# Patient Record
Sex: Male | Born: 1960 | Race: White | Hispanic: No | Marital: Single | Smoking: Current every day smoker
Health system: Southern US, Community
[De-identification: ages and names within clinical notes are randomized; demographics above are authoritative.]

## PROBLEM LIST (undated history)

## (undated) DIAGNOSIS — I38 Endocarditis, valve unspecified: Secondary | ICD-10-CM

## (undated) DIAGNOSIS — E785 Hyperlipidemia, unspecified: Secondary | ICD-10-CM

## (undated) HISTORY — PX: JOINT REPLACEMENT: SHX530

---

## 2018-05-17 ENCOUNTER — Encounter (HOSPITAL_COMMUNITY): Payer: Self-pay

## 2018-05-17 ENCOUNTER — Emergency Department (HOSPITAL_COMMUNITY)
Admission: EM | Admit: 2018-05-17 | Discharge: 2018-05-17 | Disposition: A | Discharge status: Home / Self Care | Payer: Medicare Other | Attending: Emergency Medicine | Admitting: Emergency Medicine

## 2018-05-17 ENCOUNTER — Emergency Department (HOSPITAL_COMMUNITY): Payer: Medicare Other

## 2018-05-17 DIAGNOSIS — E785 Hyperlipidemia, unspecified: Secondary | ICD-10-CM | POA: Insufficient documentation

## 2018-05-17 DIAGNOSIS — J40 Bronchitis, not specified as acute or chronic: Secondary | ICD-10-CM

## 2018-05-17 DIAGNOSIS — F17210 Nicotine dependence, cigarettes, uncomplicated: Secondary | ICD-10-CM | POA: Insufficient documentation

## 2018-05-17 DIAGNOSIS — R0789 Other chest pain: Secondary | ICD-10-CM | POA: Diagnosis present

## 2018-05-17 DIAGNOSIS — R091 Pleurisy: Secondary | ICD-10-CM | POA: Diagnosis not present

## 2018-05-17 HISTORY — DX: Hyperlipidemia, unspecified: E78.5

## 2018-05-17 HISTORY — DX: Endocarditis, valve unspecified: I38

## 2018-05-17 LAB — BASIC METABOLIC PANEL
Anion gap: 9 (ref 5–15)
BUN: 14 mg/dL (ref 6–20)
CO2: 20 mmol/L — ABNORMAL LOW (ref 22–32)
Calcium: 9 mg/dL (ref 8.9–10.3)
Chloride: 109 mmol/L (ref 98–111)
Creatinine, Ser: 1.03 mg/dL (ref 0.61–1.24)
GFR calc Af Amer: >60 mL/min (ref >60)
Glucose, Bld: 100 mg/dL — ABNORMAL HIGH (ref 70–99)
Potassium: 5.2 mmol/L — ABNORMAL HIGH (ref 3.5–5.1)
Sodium: 138 mmol/L (ref 135–145)

## 2018-05-17 LAB — CBC
HCT: 52 % (ref 39.0–52.0)
Hemoglobin: 17.1 g/dL — ABNORMAL HIGH (ref 13.0–17.0)
MCH: 30.5 pg (ref 26.0–34.0)
MCHC: 32.9 g/dL (ref 30.0–36.0)
MCV: 92.9 fL (ref 80.0–100.0)
PLATELETS: 170 10*3/uL (ref 150–400)
RBC: 5.6 MIL/uL (ref 4.22–5.81)
RDW: 12 % (ref 11.5–15.5)
WBC: 14.8 10*3/uL — ABNORMAL HIGH (ref 4.0–10.5)
nRBC: 0 % (ref 0.0–0.2)

## 2018-05-17 LAB — I-STAT TROPONIN, ED: Troponin i, poc: 0.01 ng/mL (ref 0.00–0.08)

## 2018-05-17 LAB — I-STAT CG4 LACTIC ACID, ED: Lactic Acid, Venous: 1 mmol/L (ref 0.5–1.9)

## 2018-05-17 MED ORDER — SODIUM CHLORIDE 0.9% FLUSH: 3.0000 mL | Freq: Once | INTRAVENOUS | Status: DC

## 2018-05-17 MED ORDER — IOPAMIDOL (ISOVUE-370) INJECTION 76%
INTRAVENOUS | Status: AC
  Administered 2018-05-17: 15:00:00
  Filled 2018-05-17: qty 100

## 2018-05-17 MED ORDER — HYDROCODONE-HOMATROPINE 5-1.5 MG/5ML PO SYRP: 5.0000 mL | ORAL_SOLUTION | Freq: Four times a day (QID) | ORAL | 0 refills | Status: AC | PRN

## 2018-05-17 MED ORDER — ALBUTEROL SULFATE HFA 108 (90 BASE) MCG/ACT IN AERS: 1.0000 | INHALATION_SPRAY | Freq: Four times a day (QID) | RESPIRATORY_TRACT | 0 refills | Status: AC | PRN

## 2018-05-17 MED ORDER — AZITHROMYCIN 250 MG PO TABS: ORAL_TABLET | ORAL | 0 refills | Status: AC

## 2018-05-17 MED ORDER — KETOROLAC TROMETHAMINE 30 MG/ML IJ SOLN
30.0000 mg | Freq: Once | INTRAMUSCULAR | Status: AC
  Administered 2018-05-17: 30 mg via INTRAVENOUS
  Filled 2018-05-17: qty 1

## 2018-05-17 MED ORDER — IBUPROFEN 600 MG PO TABS: 600.0000 mg | ORAL_TABLET | Freq: Four times a day (QID) | ORAL | 0 refills | Status: AC | PRN

## 2018-05-17 MED ORDER — IPRATROPIUM-ALBUTEROL 0.5-2.5 (3) MG/3ML IN SOLN
3.0000 mL | Freq: Once | RESPIRATORY_TRACT | Status: AC
  Administered 2018-05-17: 3 mL via RESPIRATORY_TRACT
  Filled 2018-05-17: qty 3

## 2018-05-17 NOTE — ED Triage Notes (Signed)
Pt arrives from home c/o chest pain on left side radiating to left shoulder. Pt stated he took "dayquil" per the pharmacist ([facility] that "he doesn't feel good." Pt states chest pain feels "like elephant sitting on chest," and intermittent stabbing pain. VSS at this time.

## 2018-05-17 NOTE — ED Notes (Signed)
ED Provider at bedside. 

## 2018-05-17 NOTE — ED Provider Notes (Signed)
[facility] EMERGENCY DEPARTMENT Provider Note   CSN: [id number] Arrival date & time: 05/17/18  1105     History   Chief Complaint Chief Complaint  Patient presents with  . Chest Pain    HPI Paul Beck is a 58 y.o. male.  HPI Patient reports that he started developing left-sided chest pain earlier today.  He reports that it radiates to his left shoulder.  He reports that he started having coughing after this started.  He reports now he has been coughing and having a sharp chest pain.  He denies he has had a fever that he is aware.  He reports he is starting developed some nasal congestion as well.  He reports he generally feels poorly.  Patient reports he is concerned because this feels similar to when he had a case of endocarditis a number of years ago.  He reports he never knew the reason that he got that.  He did not know if it was viral or bacterial.  Patient denies he has any drug abuse history.  He does smoke regularly.  No lower extremity swelling or calf pain.  Patient does not currently seek medical care in the area. Past Medical History:  Diagnosis Date  . Endocarditis   . Hyperlipemia     There are no active problems to display for this patient.   Past Surgical History:  Procedure Laterality Date  . JOINT REPLACEMENT     bilateral hip replacement 2010, 2013        Home Medications    Prior to Admission medications   Medication Sig Start Date End Date Taking? Authorizing Provider  Acetaminophen-DM (VICKS DAYQUIL HBP COLD & FLU) 325-10 MG CAPS Take 1-2 capsules by mouth every 8 (eight) hours as needed (for cold-like symptoms).    Yes [provider]  albuterol (PROVENTIL HFA;VENTOLIN HFA) 108 (90 Base) MCG/ACT inhaler Inhale 1-2 puffs into the lungs every 6 (six) hours as needed for wheezing or shortness of breath. 05/17/18   Arby Barrette, MD  azithromycin (ZITHROMAX Z-PAK) 250 MG tablet 2 po day one, then 1 daily x 4 days  05/17/18   Arby Barrette, MD  HYDROcodone-homatropine [facility]) 5-1.5 MG/5ML syrup Take 5 mLs by mouth every 6 (six) hours as needed for cough. 1 to 2 teaspoons for cough at nighttime. 05/17/18   Arby Barrette, MD  ibuprofen (ADVIL,MOTRIN) 600 MG tablet Take 1 tablet (600 mg total) by mouth every 6 (six) hours as needed. 05/17/18   Arby Barrette, MD    Family History History reviewed. No pertinent family history.  Social History Social History   Tobacco Use  . Smoking status: Current Every Day Smoker    Types: Cigarettes  . Smokeless tobacco: Never Used  . Tobacco comment: smoking since 14  Substance Use Topics  . Alcohol use: Not Currently  . Drug use: Not Currently     Allergies   Patient has no known allergies.   Review of Systems Review of Systems 10 Systems reviewed and are negative for acute change except as noted in the HPI.   Physical Exam Updated Vital Signs BP 128/82   Pulse 61   Temp 98.2 F (36.8 C)   Resp 14   Ht 6\' 6"  (1.981 m)   Wt 117 kg   SpO2 95%   BMI 29.81 kg/m   Physical Exam Constitutional:      Appearance: He is well-developed.     Comments: Patient is nontoxic and alert.  No  respiratory distress at rest.  Intermittent harsh cough paroxysmal.  HENT:     Head: Normocephalic and atraumatic.     Nose: Nose normal.     Mouth/Throat:     Mouth: Mucous membranes are moist.     Pharynx: Oropharynx is clear.  Eyes:     Pupils: Pupils are equal, round, and reactive to light.  Neck:     Musculoskeletal: Neck supple.  Cardiovascular:     Rate and Rhythm: Normal rate and regular rhythm.     Heart sounds: Normal heart sounds.  Pulmonary:     Effort: Pulmonary effort is normal.     Comments: No respiratory distress.  Intermittent coughing.  Expiratory wheeze scattered in the lung fields soft breath sounds at the bases. Abdominal:     General: Bowel sounds are normal. There is no distension.     Palpations: Abdomen is soft.     Tenderness:  There is no abdominal tenderness.  Musculoskeletal: Normal range of motion.        General: No swelling or tenderness.     Right lower leg: No edema.     Left lower leg: No edema.  Skin:    General: Skin is warm and dry.  Neurological:     Mental Status: He is alert and oriented to person, place, and time.     GCS: GCS eye subscore is 4. GCS verbal subscore is 5. GCS motor subscore is 6.     Coordination: Coordination normal.  Psychiatric:        Mood and Affect: Mood normal.      ED Treatments / Results  Labs (all labs ordered are listed, but only abnormal results are displayed) Labs Reviewed  BASIC METABOLIC PANEL - Abnormal; Notable for the following components:      Result Value   Potassium 5.2 (*)    CO2 20 (*)    Glucose, Bld 100 (*)    All other components within normal limits  CBC - Abnormal; Notable for the following components:   WBC 14.8 (*)    Hemoglobin 17.1 (*)    All other components within normal limits  I-STAT TROPONIN, ED  I-STAT CG4 LACTIC ACID, ED    EKG EKG Interpretation  Date/Time:  Wednesday May 17 2018 11:14:23 EST Ventricular Rate:  76 PR Interval:    QRS Duration: 94 QT Interval:  368 QTC Calculation: 414 R Axis:   71 Text Interpretation:  Sinus rhythm Baseline wander in lead(s) I aVL normal Confirmed by Arby BarrettePfeiffer, Florence Antonelli [phone]) on 05/17/2018 12:05:58 PM Also confirmed by Arby BarrettePfeiffer, Johnluke Haugen [phone]), editor Barbette Hairassel, Kerry [phone])  on 05/17/2018 2:51:22 PM   Radiology Dg Chest 2 View  Result Date: 05/17/2018 CLINICAL DATA:  Chest pain, shortness of breath and productive cough since last night. EXAM: CHEST - 2 VIEW COMPARISON:  None. FINDINGS: The lungs are clear. There is some peribronchial thickening. No pneumothorax or pleural effusion. Heart size is normal. No acute or focal bony abnormality. IMPRESSION: Bronchitic change.  No focal abnormality. Electronically Signed   By: Drusilla Kannerhomas  Dalessio M.D.   On: 05/17/2018 12:24   Ct Angio Chest Pe  W/cm &/or Wo Cm  Result Date: 05/17/2018 CLINICAL DATA:  Shortness of breath, chest pain EXAM: CT ANGIOGRAPHY CHEST WITH CONTRAST TECHNIQUE: Multidetector CT imaging of the chest was performed using the standard protocol during bolus administration of intravenous contrast. Multiplanar CT image reconstructions and MIPs were obtained to evaluate the vascular anatomy. CONTRAST:  ISOVUE-370 IOPAMIDOL (ISOVUE-370) INJECTION 76%  COMPARISON:  None. FINDINGS: Cardiovascular: Satisfactory opacification of the pulmonary arteries to the segmental level. No evidence of pulmonary embolism. Normal heart size. No pericardial effusion. Mediastinum/Nodes: No axillary lymphadenopathy. Prominent subcarinal lymph node measuring 14 mm in short axis. Mild right hilar lymphadenopathy. Thyroid gland, trachea, and esophagus demonstrate no significant findings. Lungs/Pleura: Lungs are clear. Mild bilateral bronchial wall thickening as can be seen with bronchitis. No pleural effusion or pneumothorax. Upper Abdomen: No acute abnormality. Musculoskeletal: No chest wall abnormality. No acute or significant osseous findings. Review of the MIP images confirms the above findings. IMPRESSION: 1. No evidence of pulmonary embolus. 2. Mild bilateral bronchial wall thickening as can be seen with bronchitis. Electronically Signed   By: Elige Ko   On: 05/17/2018 15:02    Procedures Procedures (including critical care time)  Medications Ordered in ED Medications  ipratropium-albuterol (DUONEB) 0.5-2.5 (3) MG/3ML nebulizer solution 3 mL (3 mLs Nebulization Given 05/17/18 1305)  iopamidol (ISOVUE-370) 76 % injection (  Contrast Given 05/17/18 1448)  ketorolac (TORADOL) 30 MG/ML injection 30 mg (30 mg Intravenous Given 05/17/18 1543)     Initial Impression / Assessment and Plan / ED Course  I have reviewed the triage vital signs and the nursing notes.  Pertinent labs & imaging results that were available during my care of the patient were  reviewed by me and considered in my medical decision making (see chart for details).     Patient describes chest pain with pleuritic component to it CT study does not show acute PE or underlying pneumonia.  Patient reports history of endocarditis but at this time has no murmur, no fever, no apparent risk factors.  Diagnostic evaluation is otherwise within normal limits except leukocytosis.  With smoking history, leukocytosis and finding suggestive of bronchitis\COPD will initiate on inhalers, cough suppressant and Zithromax.  Return precautions reviewed.  Patient is counseled on necessity of getting established with a primary care provider and having close follow-up.  Final Clinical Impressions(s) / ED Diagnoses   Final diagnoses:  Pleurisy  Bronchitis    ED Discharge Orders         Ordered    azithromycin (ZITHROMAX Z-PAK) 250 MG tablet     05/17/18 1533    albuterol (PROVENTIL HFA;VENTOLIN HFA) 108 (90 Base) MCG/ACT inhaler  Every 6 hours PRN     05/17/18 1533    HYDROcodone-homatropine (HYCODAN) 5-1.5 MG/5ML syrup  Every 6 hours PRN     05/17/18 1533    ibuprofen (ADVIL,MOTRIN) 600 MG tablet  Every 6 hours PRN     05/17/18 1533           Arby Barrette, MD 05/17/18 1819

## 2018-05-17 NOTE — Discharge Instructions (Signed)
1.  You need a recheck with a family doctor.  Use the referral number in your discharge instructions to find one.  Return to the emergency department if your symptoms are worsening or changing.

## 2018-05-17 NOTE — ED Notes (Addendum)
Patient transported to CT 

## 2020-08-13 IMAGING — CT CT ANGIO CHEST
3 of 7 series · 19 of 36 positions shown · IV contrast (iopamidol)
Comparison: None.

CLINICAL DATA: Shortness of breath, chest pain

EXAM:
CT ANGIOGRAPHY CHEST WITH CONTRAST
TECHNIQUE: Multidetector CT imaging of the chest was performed using the
standard protocol during bolus administration of intravenous
contrast. Multiplanar CT image reconstructions and MIPs were
obtained to evaluate the vascular anatomy.
CONTRAST:  B15VRZ-[id number] IOPAMIDOL (B15VRZ-MZ9) INJECTION 76%

[Series 6: pe lung · axial · 0.84mm/px · z∈[+1186,+1326]mm · 3 of 142 slices shown]
[im 36/142  mediastinal]
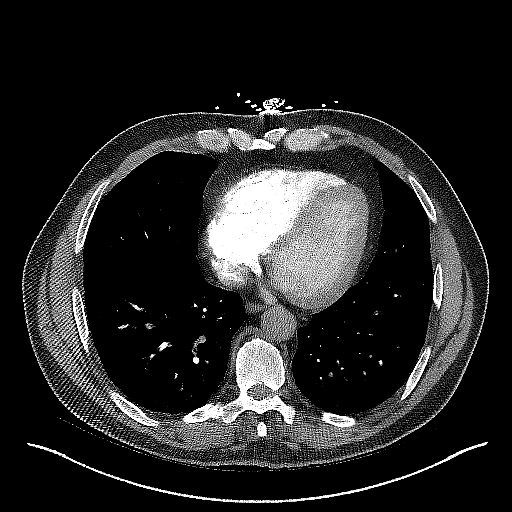
[im 71/142  mediastinal]
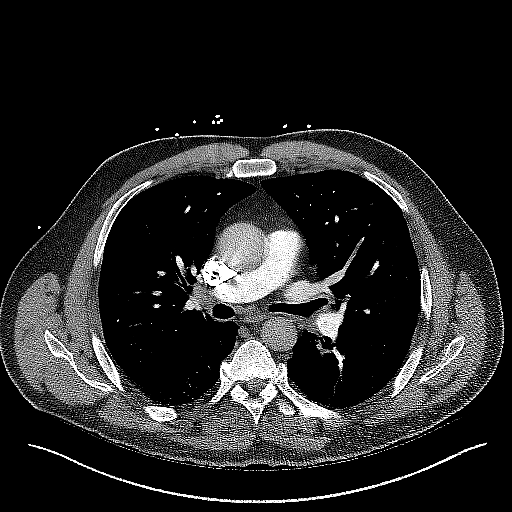
[im 106/142  mediastinal]
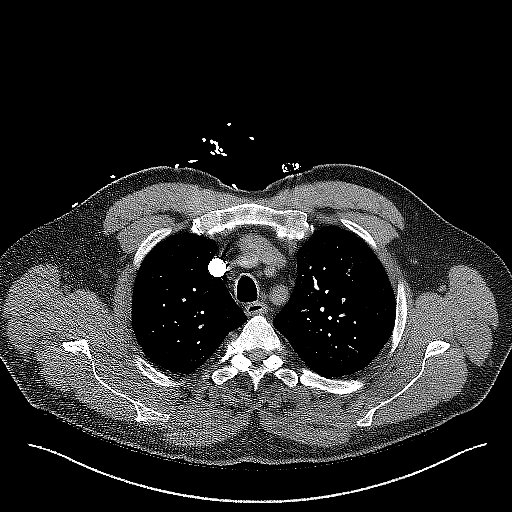

[Series 7: pe thins · axial · 0.84mm/px · z∈[+1082,+1376]mm · 15 of 483 slices shown]
[im 31/483  lung]
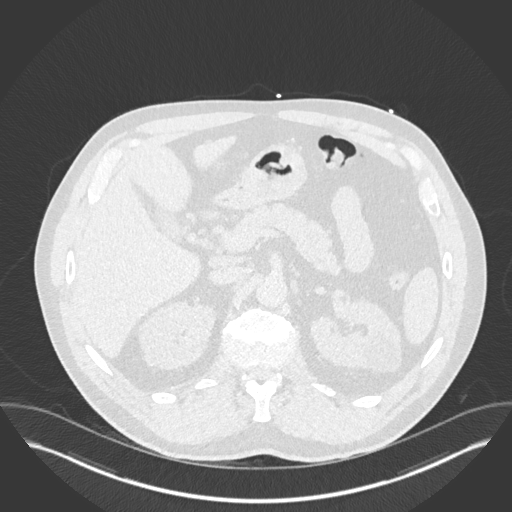
[im 61/483  mediastinal]
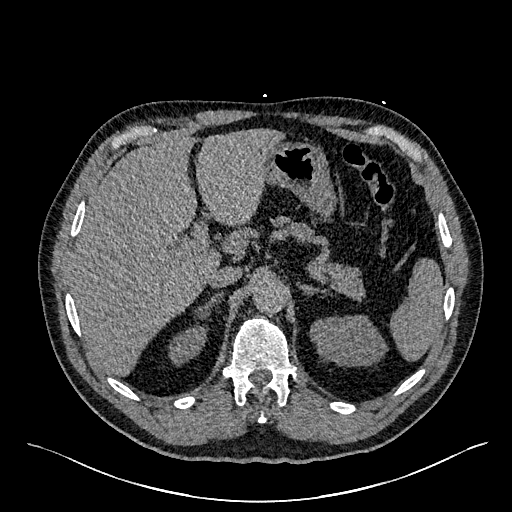
[im 91/483  lung]
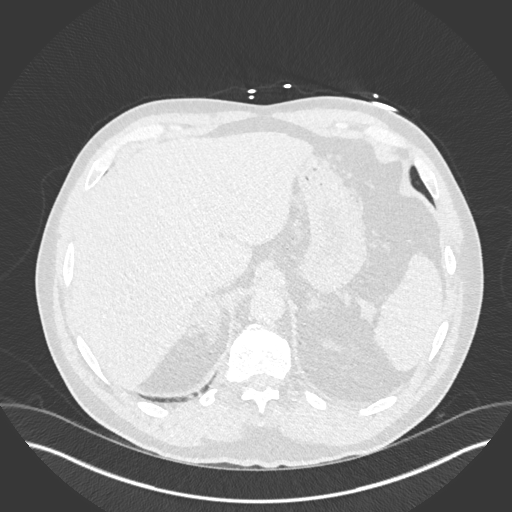
[im 121/483  mediastinal]
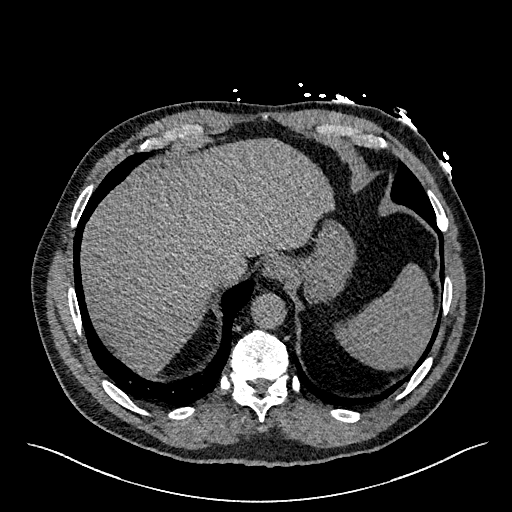
[im 151/483  lung]
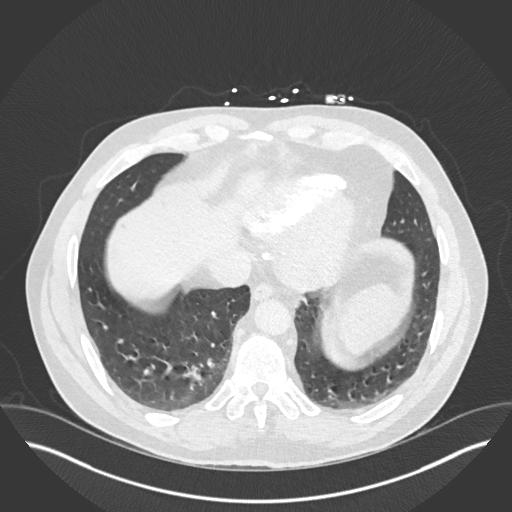
[im 181/483  mediastinal]
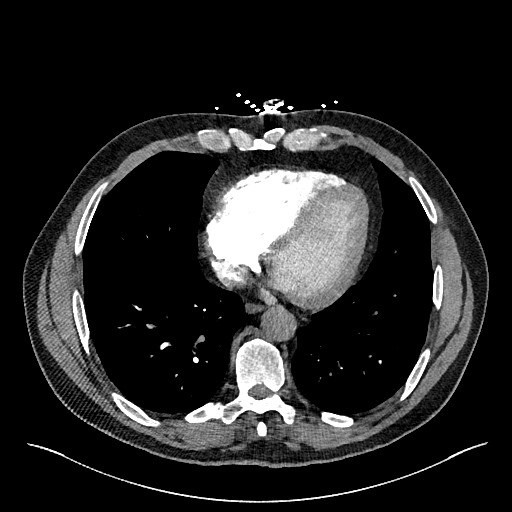
[im 211/483  lung]
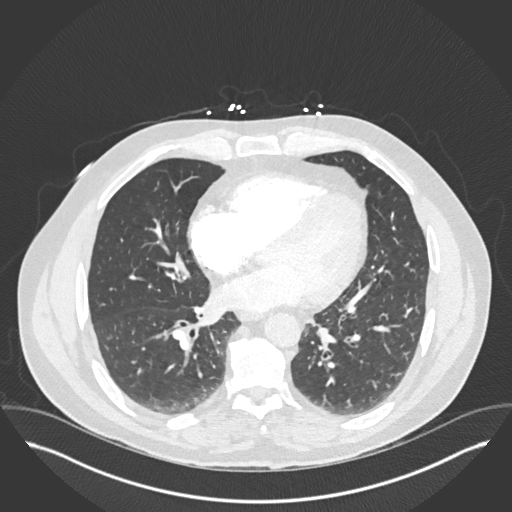
[im 242/483  mediastinal]
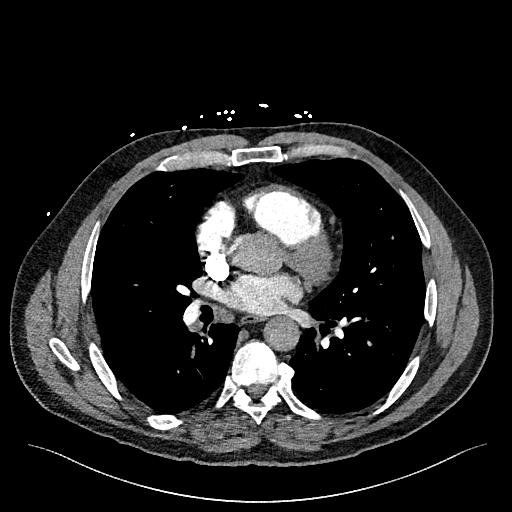
[im 272/483  lung]
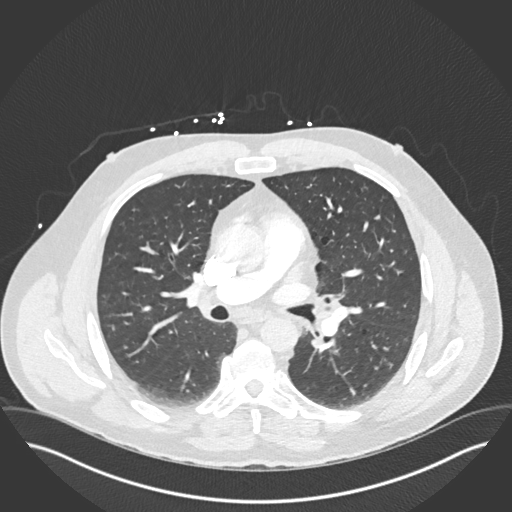
[im 302/483  mediastinal]
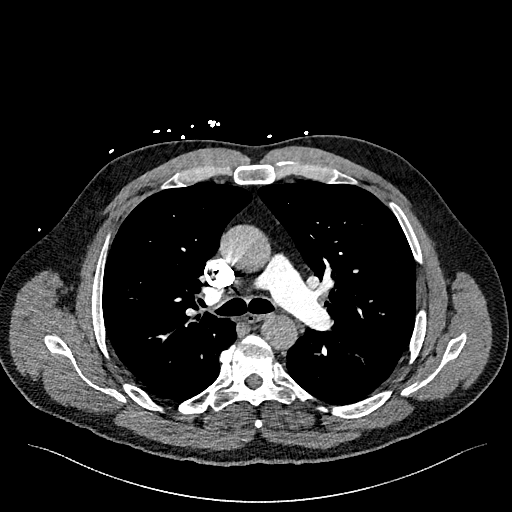
[im 332/483  lung]
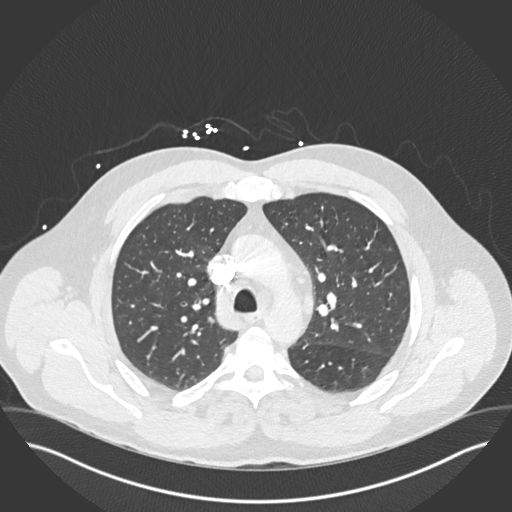
[im 362/483  mediastinal]
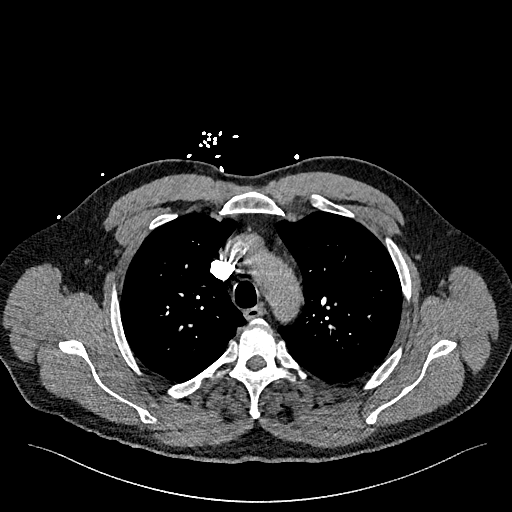
[im 392/483  lung]
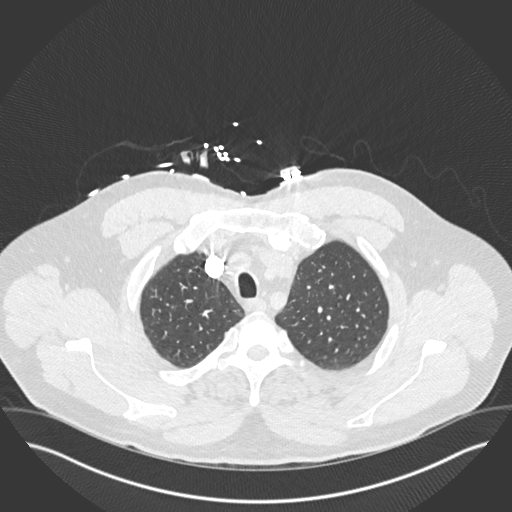
[im 422/483  mediastinal]
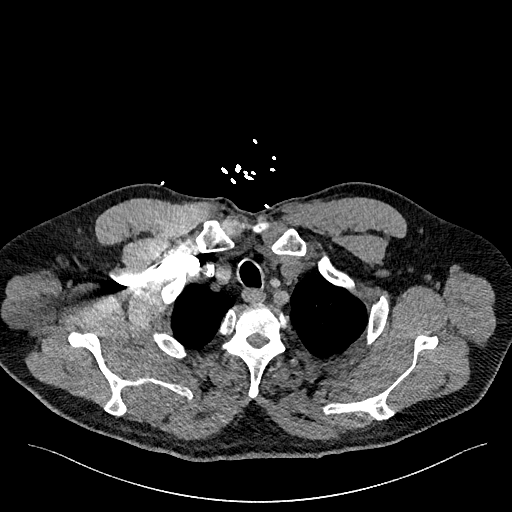
[im 452/483  lung]
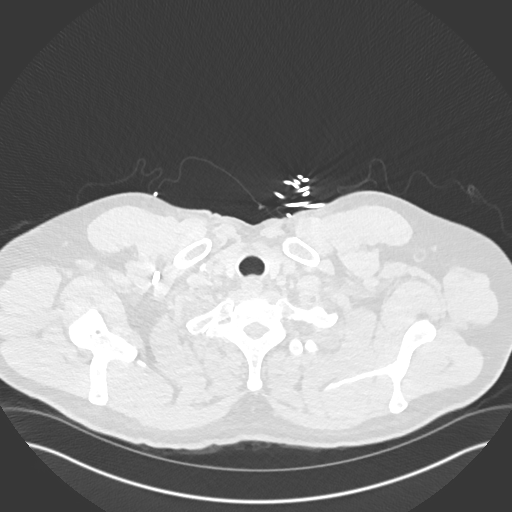

[Series 8: pe 2mm cor · coronal · 0.65mm/px · 1 of 170 slices shown]
[im 85/170  mediastinal]
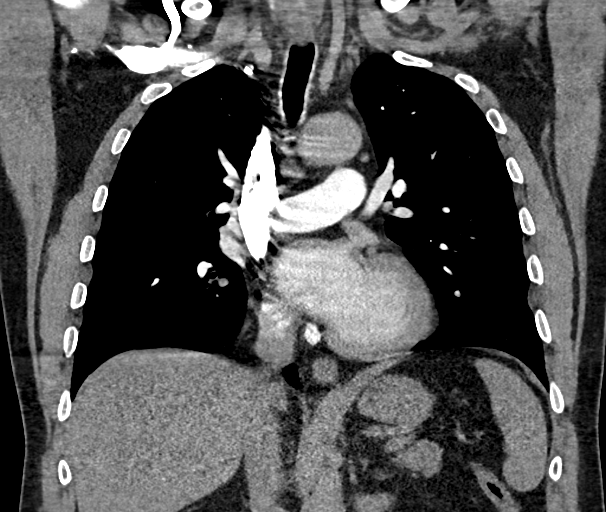

[19 of 36 positions shown; findings below may reference images not displayed]

FINDINGS: Cardiovascular: Satisfactory opacification of the pulmonary arteries
to the segmental level. No evidence of pulmonary embolism. Normal
heart size. No pericardial effusion.

Mediastinum/Nodes: No axillary lymphadenopathy. Prominent subcarinal
lymph node measuring 14 mm in short axis. Mild right hilar
lymphadenopathy. Thyroid gland, trachea, and esophagus demonstrate
no significant findings.

Lungs/Pleura: Lungs are clear. Mild bilateral bronchial wall
thickening as can be seen with bronchitis. No pleural effusion or
pneumothorax.

Upper Abdomen: No acute abnormality.

Musculoskeletal: No chest wall abnormality. No acute or significant
osseous findings.

Review of the MIP images confirms the above findings.
IMPRESSION: 1. No evidence of pulmonary embolus.
2. Mild bilateral bronchial wall thickening as can be seen with
bronchitis.
